# Patient Record
Sex: Male | Born: 1995 | Race: Black or African American | Hispanic: No | Marital: Single | Smoking: Never smoker
Health system: Southern US, Community
[De-identification: ages and names within clinical notes are randomized; demographics above are authoritative.]

## PROBLEM LIST (undated history)

## (undated) DIAGNOSIS — F329 Major depressive disorder, single episode, unspecified: Secondary | ICD-10-CM

## (undated) DIAGNOSIS — S82899A Other fracture of unspecified lower leg, initial encounter for closed fracture: Secondary | ICD-10-CM

## (undated) DIAGNOSIS — F32A Depression, unspecified: Secondary | ICD-10-CM

---
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
## 2014-01-05 ENCOUNTER — Encounter: Payer: Self-pay | Admitting: Family Medicine

## 2014-01-05 ENCOUNTER — Ambulatory Visit (INDEPENDENT_AMBULATORY_CARE_PROVIDER_SITE_OTHER): Payer: Medicaid Other | Admitting: Family Medicine

## 2014-01-05 VITALS — BP 124/74 | HR 56 | Temp 98.1°F | Resp 18 | Wt 167.0 lb

## 2014-01-05 DIAGNOSIS — H60399 Other infective otitis externa, unspecified ear: Secondary | ICD-10-CM

## 2014-01-05 DIAGNOSIS — F900 Attention-deficit hyperactivity disorder, predominantly inattentive type: Secondary | ICD-10-CM

## 2014-01-05 DIAGNOSIS — F988 Other specified behavioral and emotional disorders with onset usually occurring in childhood and adolescence: Secondary | ICD-10-CM

## 2014-01-05 DIAGNOSIS — F341 Dysthymic disorder: Secondary | ICD-10-CM

## 2014-01-05 DIAGNOSIS — F418 Other specified anxiety disorders: Secondary | ICD-10-CM

## 2014-01-05 NOTE — Assessment & Plan Note (Signed)
Doing well on Adderrall 5mg  . Continue  No further palpitations and school performance improving

## 2014-01-05 NOTE — Assessment & Plan Note (Signed)
No furhter issues.  Feel the majority of this was from family stress and illegal drug use w/ possible laced drugs

## 2014-01-05 NOTE — Progress Notes (Signed)
Bonnielee HaffMiles Bouknight is a 18 y.o. male who [facility]  ADHD: decreased adderall to 5mg  and feels less like a zombie. Better focus in school. No further palpitations. Mood around friends is nml per pt.   Depression: no further depression. No SI/HI.   Ear pain: comes and goes. Auralgan w/ relief. No using H202 bilat.  Eye Redness: occurred once in class. Unsure of allergies. Resolved w/o intervention. Sits in back of class.  The following portions of the patient's history were reviewed and updated as appropriate: allergies, current medications, past medical history, family and social history, and problem list.  Patient is a nonsmoker.  Past Medical History  Diagnosis Date  . Broken ankle     bilateral  . Depression     ROS as above otherwise neg.    Medications reviewed. Current Outpatient Prescriptions  Medication Sig Dispense Refill  . amphetamine-dextroamphetamine (ADDERALL XR) 10 MG 24 hr capsule Take 1 capsule (10 mg total) by mouth daily.  30 capsule  0  . amphetamine-dextroamphetamine (ADDERALL XR) 5 MG 24 hr capsule Take 1 capsule (5 mg total) by mouth daily.  30 capsule  0  . antipyrine-benzocaine (AURALGAN) otic solution Place 3-4 drops into the left ear every 2 (two) hours as needed for ear pain.  10 mL  0   No current facility-administered medications for this visit.    Exam:  BP 124/74  Pulse 56  Temp(Src) 98.1 F (36.7 C) (Oral)  Resp 18  Wt 167 lb (75.751 kg)  SpO2 98% Gen: Well NAD HEENT: EOMI,  MMM   No results found for this or any previous visit (from the past 72 hour(s)).  A/P (as seen in Problem list)  No problem-specific assessment & plan notes found for this encounter.

## 2014-01-05 NOTE — Assessment & Plan Note (Signed)
Continue auralgan and start H2O2 bilat

## 2014-01-05 NOTE — Patient Instructions (Signed)
Continue the Adderrall 5mg   Continue the auralgan Please use the hydrogen peroxide water mix in both ears every day for a week or two Please keep track of all of your musculoskeletal pain symptoms If your eye redness comes back then let me know and I can presceibe a drop to help Please come back in 4 weeks

## 2014-11-01 IMAGING — CR DG CHEST 2V
2 series · 2 of 2 positions shown · non-contrast
Comparison: None.

CLINICAL DATA: Shortness of breath.

EXAM:
CHEST  2 VIEW

[w chest pa]
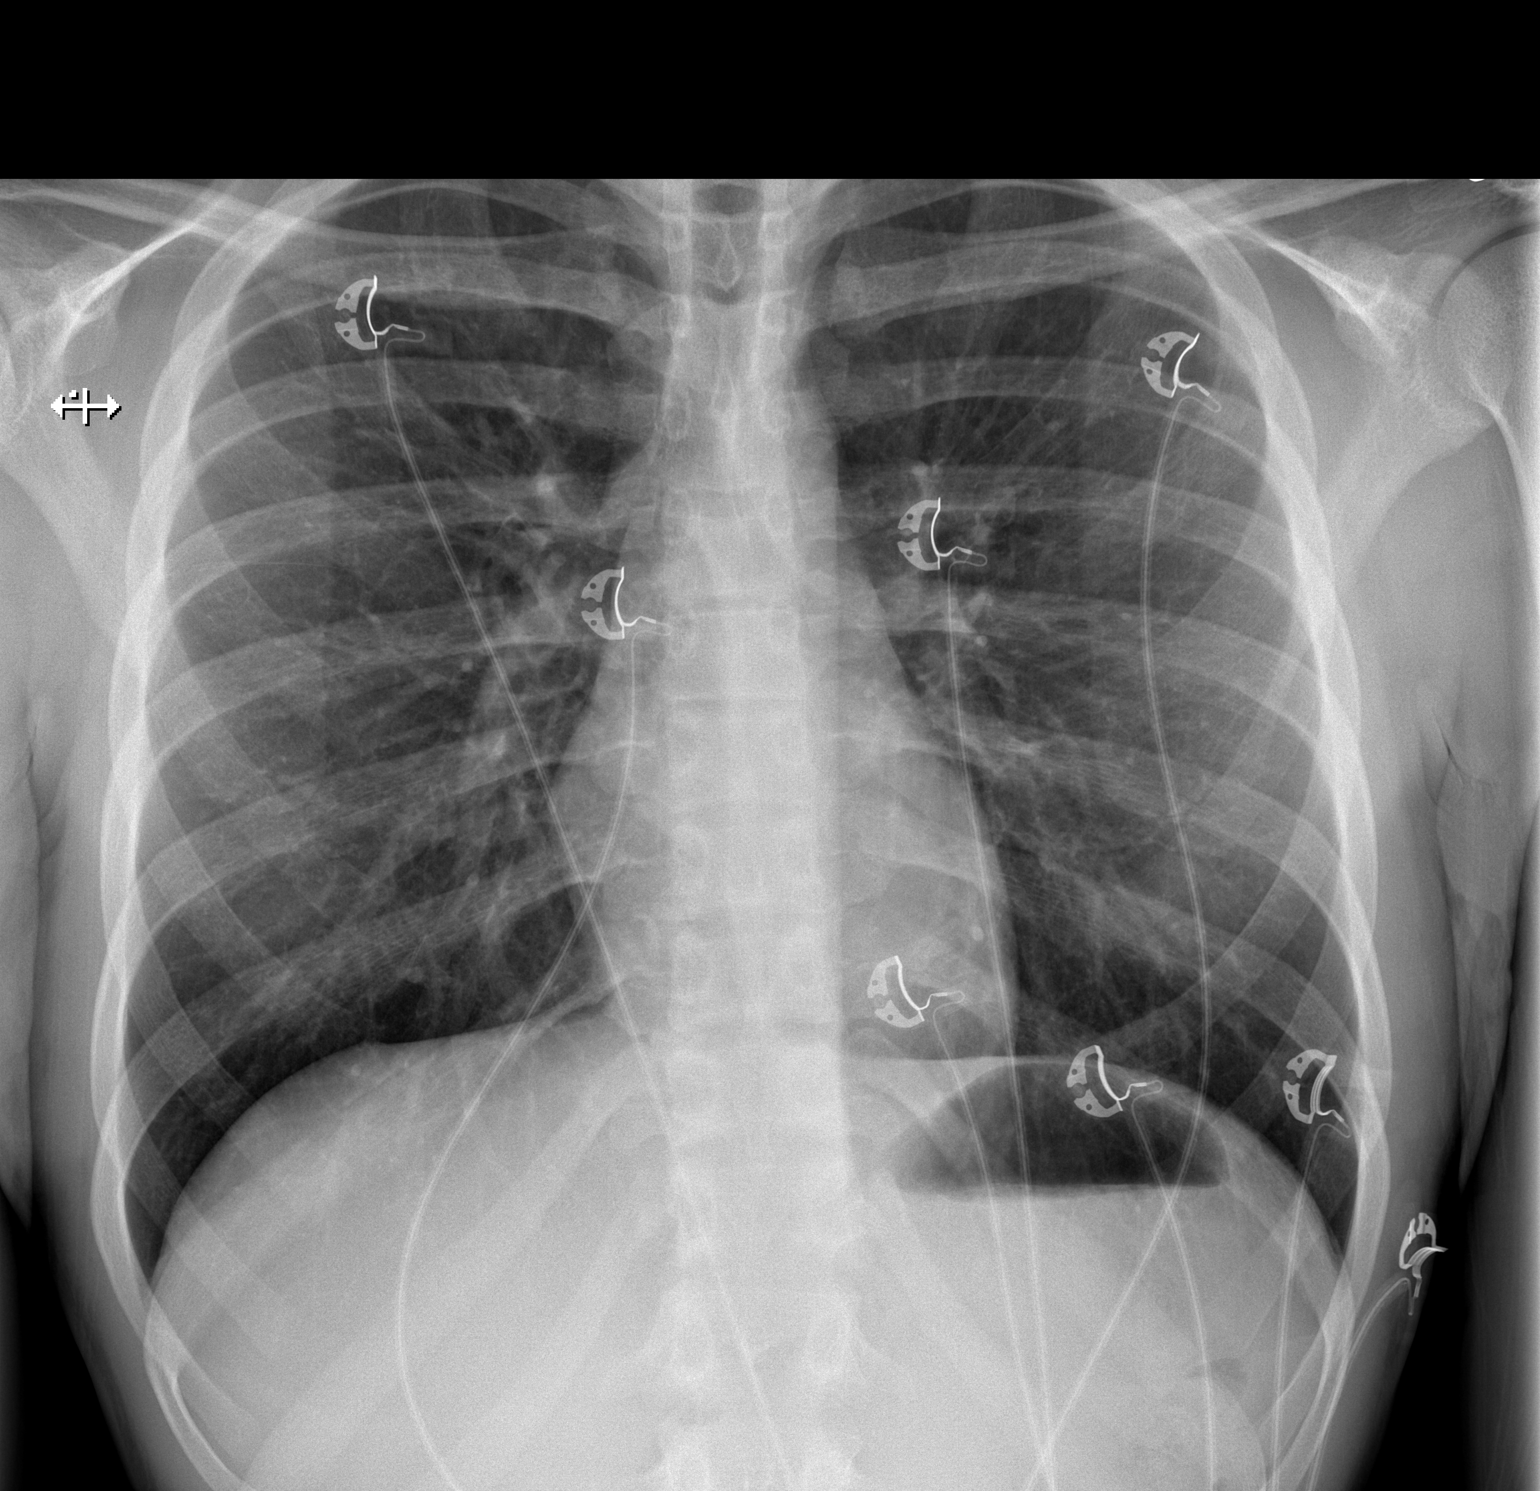

[w chest lat]
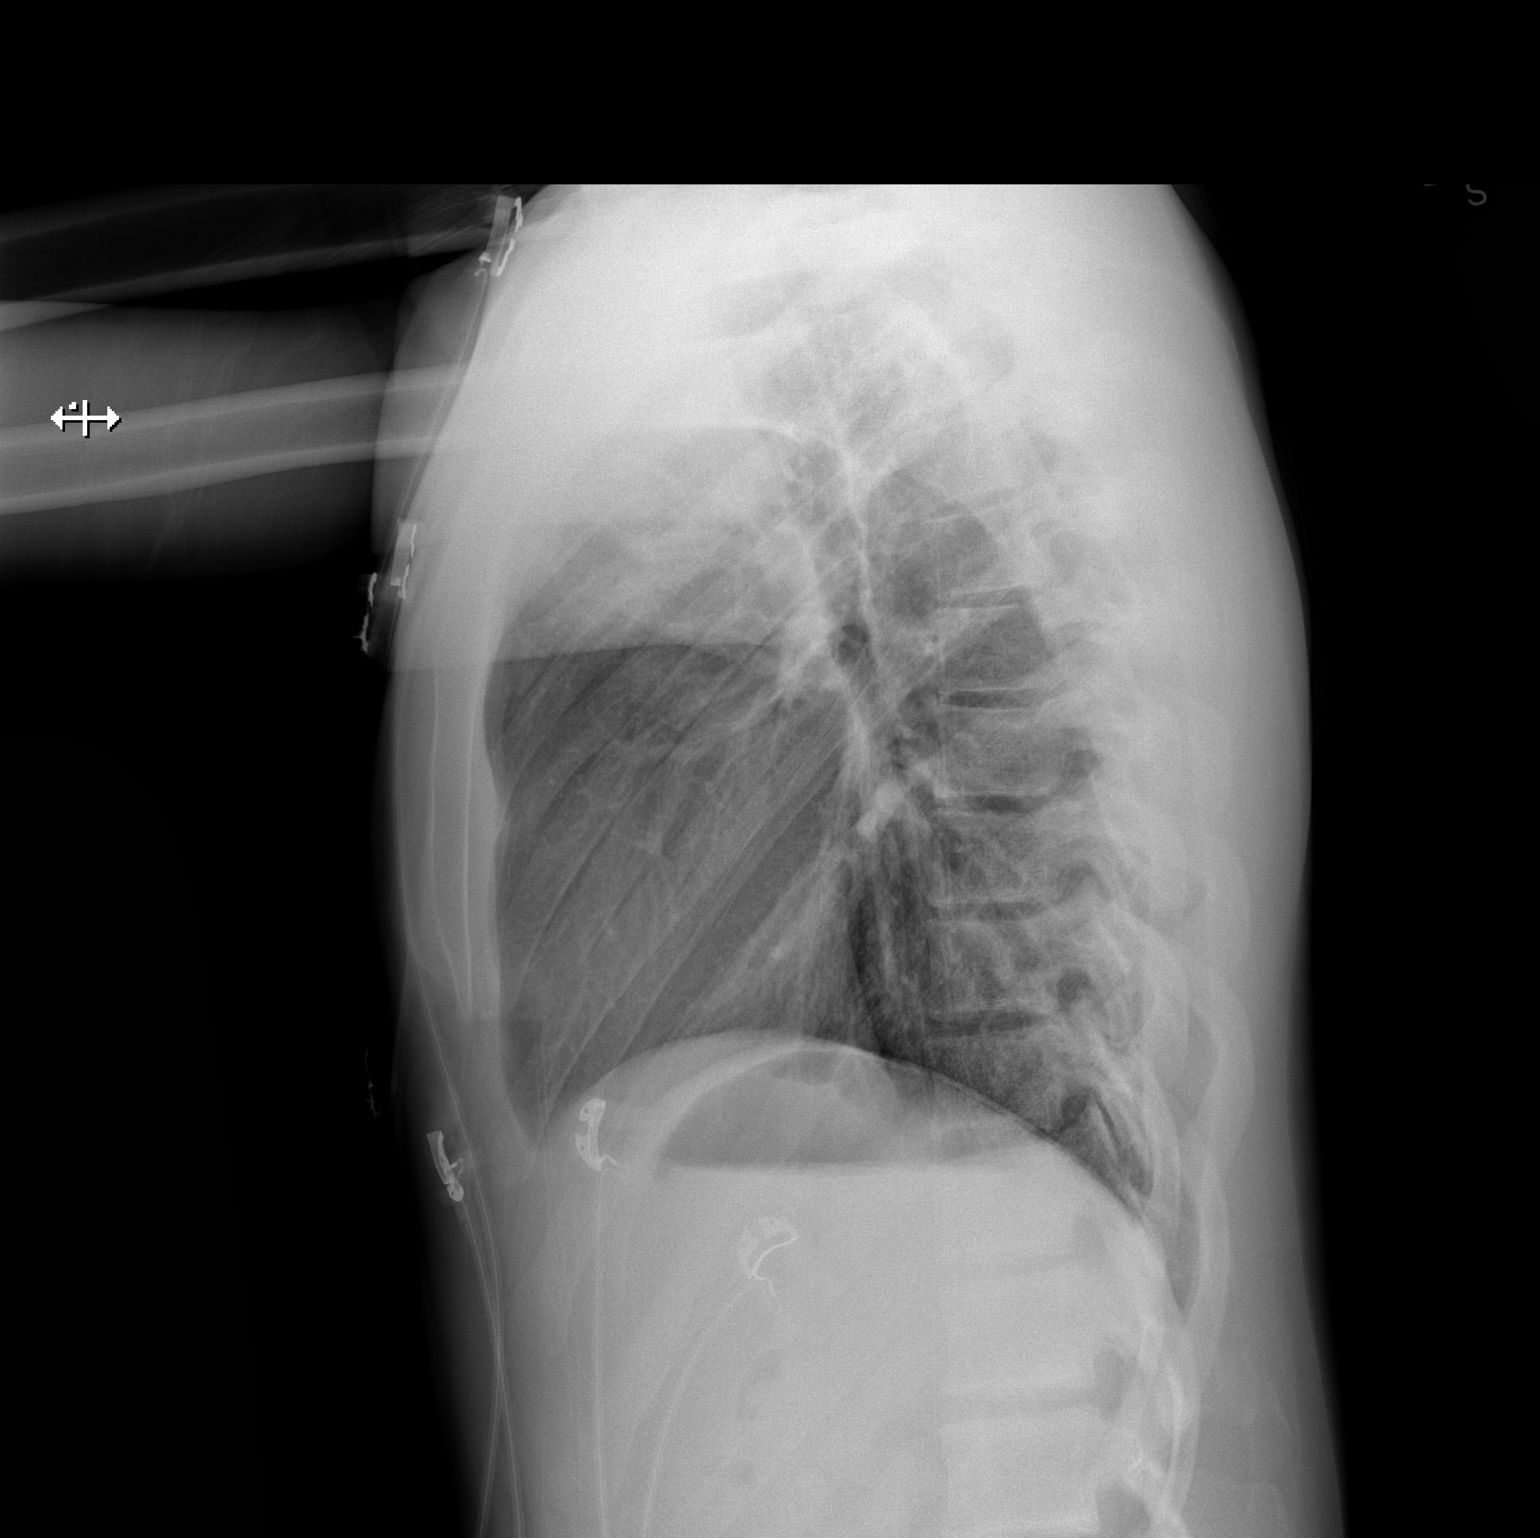

[2 of 2 positions shown; findings below may reference images not displayed]

FINDINGS: The heart size and mediastinal contours are within normal limits.
Both lungs are clear. The visualized skeletal structures are
unremarkable.
IMPRESSION: No active cardiopulmonary disease.

## 2015-02-15 ENCOUNTER — Emergency Department (HOSPITAL_COMMUNITY)
Admission: EM | Admit: 2015-02-15 | Discharge: 2015-02-15 | Disposition: A | Discharge status: Home / Self Care | Payer: Medicaid Other | Attending: Emergency Medicine | Admitting: Emergency Medicine

## 2015-02-15 ENCOUNTER — Encounter (HOSPITAL_COMMUNITY): Payer: Self-pay | Admitting: Emergency Medicine

## 2015-02-15 DIAGNOSIS — J029 Acute pharyngitis, unspecified: Secondary | ICD-10-CM | POA: Insufficient documentation

## 2015-02-15 DIAGNOSIS — Z8659 Personal history of other mental and behavioral disorders: Secondary | ICD-10-CM | POA: Insufficient documentation

## 2015-02-15 DIAGNOSIS — Z8781 Personal history of (healed) traumatic fracture: Secondary | ICD-10-CM | POA: Insufficient documentation

## 2015-02-15 NOTE — ED Notes (Addendum)
Patient comes in complaining of a sore throat and pain when swallowing his saliva.  He states that he "swallowed an apple seed about a week and half ago".  Complains that it "feels stuck in his voice box".  Patient denies SOB or fevers.  States he has difficulty swallowing foods and liquids now.  Explains that it has a "strange feeling" but denies scratchiness or pain.  Patient in no apparent distress, speaking clearly and full sentences, and ambulated in with steady gait.  No stridor or wheezing present and no excessive salivation.

## 2015-02-15 NOTE — ED Provider Notes (Signed)
CSN: [id number]     Arrival date & time 02/15/15  1613 History  This chart was scribed for [employer], PA-C, working with Eber Hong, MD by Chestine Spore, ED Scribe. The patient was seen in room WTR8/WTR8 at 5:42 PM.    Chief Complaint  Patient presents with  . Sore Throat    swallowed apple seed about a week and half ago     Patient is a 19 y.o. male presenting with pharyngitis. The history is provided by the patient. No language interpreter was used.  Sore Throat This is a new problem. The current episode started more than 1 week ago. The problem occurs constantly. The problem has not changed since onset.Pertinent negatives include no chest pain, no abdominal pain and no shortness of breath. The symptoms are aggravated by swallowing, eating and drinking. Nothing relieves the symptoms. He has tried water (and bread) for the symptoms. The treatment provided no relief.    HPI Comments: Ian Rosales is a 19 y.o. male who presents to the Emergency Department complaining of sore throat onset 1.5 weeks ago. Pt swallowed an apple seed and he thinks that the seed is still present in his "voice box". Pt rates the pain as 2/10 and states that it is constant. Pt describes the pain as sharp when he tries to swallow water or food, but states he's able to swallow liquids and solids without regurgitation or difficulty (other than pain).  He states that he has not tried any medications for the relief of his symptoms. The pt did try eating bread for the relief of his symptoms, which did not help. He denies cough, wheezing, fever, chills, rhinorrhea, ear pain, ear drainage, trismus, drooling, stridor, CP, SOB, n/v, abdominal pain, or any other symptoms. Denies sick contacts.   Past Medical History  Diagnosis Date  . Broken ankle     bilateral  . Depression    History reviewed. No pertinent past surgical history. Family History  Problem Relation Age of Onset  . Depression Mother    History   Substance Use Topics  . Smoking status: Never Smoker   . Smokeless tobacco: Not on file  . Alcohol Use: No    Review of Systems  Constitutional: Negative for fever and chills.  HENT: Positive for sore throat and trouble swallowing (painful, but able to complete without regurgitation). Negative for drooling, ear discharge, ear pain and rhinorrhea.   Respiratory: Negative for cough, shortness of breath, wheezing and stridor.   Cardiovascular: Negative for chest pain.  Gastrointestinal: Negative for nausea, vomiting and abdominal pain.  Musculoskeletal: Negative for neck pain.  Allergic/Immunologic: Negative for immunocompromised state.    A complete 10 system review of systems was obtained and all systems are negative except as noted in the HPI and PMH.    Allergies  Review of patient's allergies indicates no known allergies.  Home Medications   Prior to Admission medications   Medication Sig Start Date End Date Taking? Authorizing Provider  amphetamine-dextroamphetamine (ADDERALL XR) 10 MG 24 hr capsule Take 1 capsule (10 mg total) by mouth daily. Patient not taking: Reported on 02/15/2015 12/14/13   Benjaman Pott, MD  amphetamine-dextroamphetamine (ADDERALL XR) 5 MG 24 hr capsule Take 1 capsule (5 mg total) by mouth daily. Patient not taking: Reported on 02/15/2015 12/25/13   Ozella Rocks, MD  antipyrine-benzocaine Lyla Son) otic solution Place 3-4 drops into the left ear every 2 (two) hours as needed for ear pain. Patient not taking: Reported on 02/15/2015  12/18/13   Ozella Rocksavid J Merrell, MD   BP 125/67 mmHg  Pulse 85  Temp(Src) 98 F (36.7 C) (Oral)  SpO2 96%   Physical Exam  Constitutional: He is oriented to person, place, and time. Vital signs are normal. He appears well-developed and well-nourished.  Non-toxic appearance. No distress.  Afebrile, nontoxic, NAD  HENT:  Head: Normocephalic and atraumatic.  Mouth/Throat: Uvula is midline, oropharynx is clear and moist and  mucous membranes are normal. No trismus in the jaw. No uvula swelling.  Oropharynx clear and moist without erythema or tonsillar exudate, patent airway without obvious abrasions in the oropharynx. No drooling, trismus, or stridor.   Eyes: Conjunctivae and EOM are normal. Right eye exhibits no discharge. Left eye exhibits no discharge.  Neck: Normal range of motion. Neck supple.  Cardiovascular: Normal rate.   Pulmonary/Chest: Effort normal. No respiratory distress.  Abdominal: Normal appearance. He exhibits no distension.  Musculoskeletal: Normal range of motion.  Neurological: He is alert and oriented to person, place, and time. He has normal strength. No sensory deficit.  Skin: Skin is warm, dry and intact. No rash noted.  Psychiatric: He has a normal mood and affect.  Nursing note and vitals reviewed.   ED Course  Procedures (including critical care time) DIAGNOSTIC STUDIES: Oxygen Saturation is 96% on RA, normal by my interpretation.    COORDINATION OF CARE: 5:47 PM-Discussed treatment plan which includes f/u with GI specialist, keep drinking fluids, f/u if the symptoms worsen with pt at bedside and pt agreed to plan.   Labs Review Labs Reviewed - No data to display  Imaging Review No results found.   EKG Interpretation None      MDM   Final diagnoses:  Sore throat    19 y.o. male here with sensation of having an apple seed "stuck" in his "voice box". Patent airway without drooling or stridor, no visualized abrasions or wounds to posterior oropharynx, no FB noted on exam. Discussed that pt likely had small abrasion to esophagus which is causing globus sensation, but that without direct visualization of piriform recess/vocal folds, I cannot completely r/o retained FB, although I think it's unlikely. Discussed f/up with GI for possible endoscopy/direct visualization of this area in order to definitively determine if it's present or not. Discussed use of chloraseptic spray and  copious amounts of water to help flush any possible FBs out. I explained the diagnosis and have given explicit precautions to return to the ER including for any other new or worsening symptoms. The patient understands and accepts the medical plan as it's been dictated and I have answered their questions. Discharge instructions concerning home care and prescriptions have been given. The patient is STABLE and is discharged to home in good condition.   I personally performed the services described in this documentation, which was scribed in my presence. The recorded information has been reviewed and is accurate.  BP 127/70 mmHg  Pulse 58  Temp(Src) 98 F (36.7 C) (Oral)  Resp 18  SpO2 99%    Chelesea Weiand Camprubi-Soms, PA-C 02/15/15 2105  Eber HongBrian Miller, MD 02/17/15 [phone]

## 2015-02-15 NOTE — ED Notes (Signed)
Pt not drooling, no stridor noted. Able to speak full/clear sentences.

## 2015-02-15 NOTE — Discharge Instructions (Signed)
Call the gastroenterologist on Monday to make an appointment for further evaluation for the sensation of an apple seed stuck in your throat. Stay well hydrated and drink a lot of water to try to flush it out. Use chloraseptic spray as needed for pain. Return to the ER for changes or worsening symptoms.   Sore Throat A sore throat is pain, burning, irritation, or scratchiness of the throat. There is often pain or tenderness when swallowing or talking. A sore throat may be accompanied by other symptoms, such as coughing, sneezing, fever, and swollen neck glands. A sore throat is often the first sign of another sickness, such as a cold, flu, strep throat, or mononucleosis (commonly known as mono). Most sore throats go away without medical treatment. CAUSES  The most common causes of a sore throat include:  A viral infection, such as a cold, flu, or mono.  A bacterial infection, such as strep throat, tonsillitis, or whooping cough.  Seasonal allergies.  Dryness in the air.  Irritants, such as smoke or pollution.  Gastroesophageal reflux disease (GERD). HOME CARE INSTRUCTIONS   Only take over-the-counter medicines as directed by your caregiver.  Drink enough fluids to keep your urine clear or pale yellow.  Rest as needed.  Try using throat sprays, lozenges, or sucking on hard candy to ease any pain (if older than 4 years or as directed).  Sip warm liquids, such as broth, herbal tea, or warm water with honey to relieve pain temporarily. You may also eat or drink cold or frozen liquids such as frozen ice pops.  Gargle with salt water (mix 1 tsp salt with 8 oz of water).  Do not smoke and avoid secondhand smoke.  Put a cool-mist humidifier in your bedroom at night to moisten the air. You can also turn on a hot shower and sit in the bathroom with the door closed for 5-10 minutes. SEEK IMMEDIATE MEDICAL CARE IF:  You have difficulty breathing.  You are unable to swallow fluids, soft  foods, or your saliva.  You have increased swelling in the throat.  Your sore throat does not get better in 7 days.  You have nausea and vomiting.  You have a fever or persistent symptoms for more than 2-3 days.  You have a fever and your symptoms suddenly get worse. MAKE SURE YOU:   Understand these instructions.  Will watch your condition.  Will get help right away if you are not doing well or get worse. Document Released: 12/24/2004 Document Revised: 11/02/2012 Document Reviewed: 07/24/2012 [facility] Patient Information 2015 [employer]. This information is not intended to replace advice given to you by your health care provider. Make sure you discuss any questions you have with your health care provider.

## 2021-10-11 ENCOUNTER — Emergency Department (HOSPITAL_COMMUNITY)
Admission: EM | Admit: 2021-10-11 | Discharge: 2021-10-12 | Disposition: A | Discharge status: Other Acute Inpatient Hospital | Payer: No Typology Code available for payment source | Attending: Emergency Medicine | Admitting: Emergency Medicine

## 2021-10-11 ENCOUNTER — Encounter (HOSPITAL_COMMUNITY): Payer: Self-pay

## 2021-10-11 DIAGNOSIS — Z20822 Contact with and (suspected) exposure to covid-19: Secondary | ICD-10-CM | POA: Diagnosis not present

## 2021-10-11 DIAGNOSIS — F411 Generalized anxiety disorder: Secondary | ICD-10-CM | POA: Insufficient documentation

## 2021-10-11 DIAGNOSIS — R443 Hallucinations, unspecified: Secondary | ICD-10-CM | POA: Insufficient documentation

## 2021-10-11 DIAGNOSIS — R45851 Suicidal ideations: Secondary | ICD-10-CM | POA: Diagnosis not present

## 2021-10-11 DIAGNOSIS — F332 Major depressive disorder, recurrent severe without psychotic features: Secondary | ICD-10-CM | POA: Insufficient documentation

## 2021-10-11 LAB — COMPREHENSIVE METABOLIC PANEL
ALT: 13 U/L (ref 0–44)
AST: 20 U/L (ref 15–41)
Albumin: 4.4 g/dL (ref 3.5–5.0)
Alkaline Phosphatase: 52 U/L (ref 38–126)
Anion gap: 6 (ref 5–15)
BUN: 11 mg/dL (ref 6–20)
CO2: 28 mmol/L (ref 22–32)
Calcium: 9.2 mg/dL (ref 8.9–10.3)
Chloride: 105 mmol/L (ref 98–111)
Creatinine, Ser: 1.01 mg/dL (ref 0.61–1.24)
GFR, Estimated: >60 mL/min (ref >60)
Glucose, Bld: 90 mg/dL (ref 70–99)
Potassium: 3.8 mmol/L (ref 3.5–5.1)
Sodium: 139 mmol/L (ref 135–145)
Total Bilirubin: 1.7 mg/dL — ABNORMAL HIGH (ref 0.3–1.2)
Total Protein: 7.3 g/dL (ref 6.5–8.1)

## 2021-10-11 LAB — CBC WITH DIFFERENTIAL/PLATELET
Abs Immature Granulocytes: 0.01 10*3/uL (ref 0.00–0.07)
Basophils Absolute: 0 10*3/uL (ref 0.0–0.1)
Basophils Relative: 1 %
Eosinophils Absolute: 0 10*3/uL (ref 0.0–0.5)
Eosinophils Relative: 1 %
HCT: 43.7 % (ref 39.0–52.0)
Hemoglobin: 15.4 g/dL (ref 13.0–17.0)
Immature Granulocytes: 0 %
Lymphocytes Relative: 31 %
Lymphs Abs: 1.3 10*3/uL (ref 0.7–4.0)
MCH: 30.8 pg (ref 26.0–34.0)
MCHC: 35.2 g/dL (ref 30.0–36.0)
MCV: 87.4 fL (ref 80.0–100.0)
Monocytes Absolute: 0.5 10*3/uL (ref 0.1–1.0)
Monocytes Relative: 12 %
Neutro Abs: 2.3 10*3/uL (ref 1.7–7.7)
Neutrophils Relative %: 55 %
Platelets: 234 10*3/uL (ref 150–400)
RBC: 5 MIL/uL (ref 4.22–5.81)
RDW: 12.4 % (ref 11.5–15.5)
WBC: 4.1 10*3/uL (ref 4.0–10.5)
nRBC: 0 % (ref 0.0–0.2)

## 2021-10-11 LAB — RAPID URINE DRUG SCREEN, HOSP PERFORMED
Amphetamines: NOT DETECTED
Barbiturates: NOT DETECTED
Benzodiazepines: NOT DETECTED
Cocaine: NOT DETECTED
Opiates: NOT DETECTED
Tetrahydrocannabinol: NOT DETECTED

## 2021-10-11 LAB — ETHANOL: Alcohol, Ethyl (B): <10 mg/dL (ref <10)

## 2021-10-11 LAB — RESP PANEL BY RT-PCR (FLU A&B, COVID) ARPGX2
Influenza A by PCR: NEGATIVE
Influenza B by PCR: NEGATIVE
SARS Coronavirus 2 by RT PCR: NEGATIVE

## 2021-10-11 MED ORDER — IBUPROFEN 200 MG PO TABS
400.0000 mg | ORAL_TABLET | Freq: Four times a day (QID) | ORAL | Status: DC | PRN
  Administered 2021-10-11: 400 mg via ORAL
  Filled 2021-10-11: qty 2

## 2021-10-11 MED ORDER — MELATONIN 3 MG PO TABS
3.0000 mg | ORAL_TABLET | Freq: Every day | ORAL | Status: DC
  Administered 2021-10-11: 3 mg via ORAL
  Filled 2021-10-11: qty 1

## 2021-10-11 NOTE — ED Notes (Addendum)
Pt brought with him wallet, Phone, Hoodie, pants and a pair of shoes. Items have been placed behind triage area. Pt has been wanded down by security

## 2021-10-11 NOTE — ED Provider Notes (Signed)
Emergency Medicine Provider Triage Evaluation Note  Ian Rosales , a 25 y.o. male  was evaluated in triage.  Pt complains of suicidal ideation.  Patient states he has a history of anxiety which has been worsening over the past few months to the point where he has now having thoughts of ending his life.  He denies HI, visual or auditory hallucinations.  Review of Systems  Positive: SI Negative: HI, hallucinations  Physical Exam  BP (!) 151/87 (BP Location: Right Arm)   Pulse 84   Temp 98.8 F (37.1 C) (Oral)   Resp 18   SpO2 97%  Gen:   Awake, no distress   Resp:  Normal effort MSK:   Moves extremities without difficulty  Other:    Medical Decision Making  Medically screening exam initiated at 12:34 PM.  Appropriate orders placed.  Drue Harr was informed that the remainder of the evaluation will be completed by another provider, this initial triage assessment does not replace that evaluation, and the importance of remaining in the ED until their evaluation is complete.     Vear Clock 10/11/21 1236    Tegeler, Canary Brim, MD 10/11/21 1505

## 2021-10-11 NOTE — ED Triage Notes (Signed)
Pt reports increased anxiety over the past couple of months. The Anxiety has worsened to where he has been experiencing thoughts of suicide. Denies any plan. No HI.

## 2021-10-11 NOTE — ED Notes (Signed)
Pt NAD in bed, a/ox4. Slightly fast speech. Pt states he had been travelling and staying in [place] where he developed a sudden onset of anxiety followed later by suicidal thoughts. Pt states he has had anxiety and depression as a teen and then it went away while in the [employer]. Pt denies specific plan for suicide, but states "I am just too comfortable with those thoughts". Pt denies auditory or visual hallucinations. Denies drug/ETOH use.

## 2021-10-11 NOTE — ED Provider Notes (Signed)
[facility]-EMERGENCY DEPT Provider Note   CSN: [id number] Arrival date & time: 10/11/21  1134     History Chief Complaint  Patient presents with   Suicidal    Ian Rosales is a 25 y.o. male.  HPI Patient presents complaining of anxiety, ongoing for several weeks, with intermittent thoughts of suicide, without plan.  He is also occasionally having troubling thoughts.  He described a vision of a small child, an attractive girl, and this image bothered him.  He does not have any command hallucinations.  He states he had been visiting [place], several weeks ago and was doing a lot of drugs while there.  His anxiety started during that period of time, as well as a thoughts of suicide.  He currently lives with family members were supportive.  He states that his father brought him here.  He denies recent illnesses including fever, vomiting, cough or chest pain.  There are no other known active modifying factors.    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 No pertinent surgical history.     Family History  Problem Relation Age of Onset   Depression Mother     Social History   Tobacco Use   Smoking status: Never   Smokeless tobacco: Never  Substance Use Topics   Alcohol use: No   Drug use: No    Types: Marijuana    Home Medications Prior to Admission medications   Medication Sig Start Date End Date Taking? Authorizing Provider  amphetamine-dextroamphetamine (ADDERALL XR) 10 MG 24 hr capsule Take 1 capsule (10 mg total) by mouth daily. Patient not taking: No sig reported 12/14/13   Benjaman Pott, MD  amphetamine-dextroamphetamine (ADDERALL XR) 5 MG 24 hr capsule  Take 1 capsule (5 mg total) by mouth daily. Patient not taking: No sig reported 12/25/13   Ozella Rocks, MD  antipyrine-benzocaine Lyla Son) otic solution Place 3-4 drops into the left ear every 2 (two) hours as needed for ear pain. Patient not taking: No sig reported 12/18/13   Ozella Rocks, MD    Allergies    Patient has no known allergies.  Review of Systems   Review of Systems  All other systems reviewed and are negative.  Physical Exam Updated Vital Signs BP (!) 148/89 (BP Location: Right Arm)   Pulse 69   Temp 98.8 F (37.1 C) (Oral)   Resp 16   SpO2 98%   Physical Exam Vitals and nursing note reviewed.  Constitutional:      General: He is not in acute distress.    Appearance: He is well-developed. He is not ill-appearing, toxic-appearing or diaphoretic.  HENT:     Head: Normocephalic and atraumatic.     Right Ear: External ear normal.     Left Ear: External ear normal.  Eyes:     Conjunctiva/sclera: Conjunctivae normal.     Pupils: Pupils are equal, round, and reactive to light.  Neck:     Trachea: Phonation normal.  Cardiovascular:     Rate and Rhythm: Normal rate.  Pulmonary:     Effort: Pulmonary effort is normal.  Abdominal:     General: There is no distension.  Palpations: Abdomen is soft.  Musculoskeletal:        General: Normal range of motion.     Cervical back: Normal range of motion and neck supple.  Skin:    General: Skin is warm and dry.     Coloration: Skin is not jaundiced or pale.  Neurological:     Mental Status: He is alert and oriented to person, place, and time.     Cranial Nerves: No cranial nerve deficit.     Sensory: No sensory deficit.     Motor: No abnormal muscle tone.     Coordination: Coordination normal.  Psychiatric:        Mood and Affect: Mood normal.        Behavior: Behavior normal.        Thought Content: Thought content normal.        Judgment: Judgment normal.    ED Results / Procedures / Treatments    Labs (all labs ordered are listed, but only abnormal results are displayed) Labs Reviewed  COMPREHENSIVE METABOLIC PANEL - Abnormal; Notable for the following components:      Result Value   Total Bilirubin 1.7 (*)    All other components within normal limits  RESP PANEL BY RT-PCR (FLU A&B, COVID) ARPGX2  ETHANOL  RAPID URINE DRUG SCREEN, HOSP PERFORMED  CBC WITH DIFFERENTIAL/PLATELET    EKG None  Radiology No results found.  Procedures Procedures   Medications Ordered in ED Medications - No data to display  ED Course  I have reviewed the triage vital signs and the nursing notes.  Pertinent labs & imaging results that were available during my care of the patient were reviewed by me and considered in my medical decision making (see chart for details).    MDM Rules/Calculators/A&P                            Patient Vitals for the past 24 hrs:  BP Temp Temp src Pulse Resp SpO2  10/11/21 1857 (!) 148/89 -- -- 69 16 98 %  10/11/21 1659 (!) 141/82 -- -- 80 16 99 %  10/11/21 1139 (!) 151/87 98.8 F (37.1 C) Oral 84 18 97 %    7:42 PM Reevaluation with update and discussion. After initial assessment and treatment, an updated evaluation reveals resting comfortably in bed. [name]   Medical Decision Making:  This patient is presenting for evaluation of suicidal ideation and anxiety, which does require a range of treatment options, and is a complaint that involves a moderate risk of morbidity and mortality. The differential diagnoses include depression, anxiety, suicidal ideation. I decided to review old records, and in summary patient with polysubstance abuse, presenting with suicidality and troubling visions.  I did not require additional historical information from anyone.  Clinical Laboratory Tests Ordered, included CBC, Metabolic panel, and alcohol level, UDS , viral panel. Review indicates normal.   Critical Interventions-clinical evaluation, laboratory  testing, TTS consultation  After These Interventions, the Patient was reevaluated and was found medically cleared for treatment by psychiatry.  CRITICAL CARE-no Performed by: [name]  Nursing Notes Reviewed/ Care Coordinated Applicable Imaging Reviewed Interpretation of Laboratory Data incorporated into ED treatment  Plan disposition by TTS in conjunction with oncoming provider team    Final Clinical Impression(s) / ED Diagnoses Final diagnoses:  Suicidal ideation    Rx / DC Orders ED Discharge Orders     None  [name], MD 10/11/21 1944
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
## 2021-10-11 NOTE — ED Notes (Signed)
Pt care taken, father is with patient.

## 2021-10-12 ENCOUNTER — Ambulatory Visit (HOSPITAL_COMMUNITY)
Admission: EM | Admit: 2021-10-12 | Discharge: 2021-10-12 | Disposition: A | Discharge status: Home / Self Care | Payer: No Typology Code available for payment source | Attending: Nurse Practitioner | Admitting: Nurse Practitioner

## 2021-10-12 DIAGNOSIS — F410 Panic disorder [episodic paroxysmal anxiety] without agoraphobia: Secondary | ICD-10-CM | POA: Diagnosis not present

## 2021-10-12 DIAGNOSIS — F411 Generalized anxiety disorder: Secondary | ICD-10-CM | POA: Diagnosis not present

## 2021-10-12 DIAGNOSIS — F332 Major depressive disorder, recurrent severe without psychotic features: Secondary | ICD-10-CM

## 2021-10-12 LAB — LIPID PANEL
Cholesterol: 189 mg/dL (ref 0–200)
HDL: 39 mg/dL — ABNORMAL LOW (ref >40)
LDL Cholesterol: 140 mg/dL — ABNORMAL HIGH (ref 0–99)
Total CHOL/HDL Ratio: 4.8 RATIO
Triglycerides: 50 mg/dL (ref <150)
VLDL: 10 mg/dL (ref 0–40)

## 2021-10-12 LAB — HEMOGLOBIN A1C
Hgb A1c MFr Bld: 4.1 % — ABNORMAL LOW (ref 4.8–5.6)
Mean Plasma Glucose: 70.97 mg/dL

## 2021-10-12 LAB — TSH: TSH: 2.17 u[IU]/mL (ref 0.350–4.500)

## 2021-10-12 LAB — MAGNESIUM: Magnesium: 2 mg/dL (ref 1.7–2.4)

## 2021-10-12 MED ORDER — ACETAMINOPHEN 325 MG PO TABS: 650.0000 mg | ORAL_TABLET | Freq: Four times a day (QID) | ORAL | Status: DC | PRN

## 2021-10-12 MED ORDER — MAGNESIUM HYDROXIDE 400 MG/5ML PO SUSP: 30.0000 mL | Freq: Every day | ORAL | Status: DC | PRN

## 2021-10-12 MED ORDER — MIRTAZAPINE 7.5 MG PO TABS: 7.5000 mg | ORAL_TABLET | Freq: Every day | ORAL | 0 refills | Status: AC

## 2021-10-12 MED ORDER — GABAPENTIN 100 MG PO CAPS: 100.0000 mg | ORAL_CAPSULE | Freq: Two times a day (BID) | ORAL | Status: DC

## 2021-10-12 MED ORDER — MIRTAZAPINE 7.5 MG PO TABS
7.5000 mg | ORAL_TABLET | Freq: Once | ORAL | Status: AC
  Administered 2021-10-12: 7.5 mg via ORAL
  Filled 2021-10-12: qty 1

## 2021-10-12 MED ORDER — GABAPENTIN 100 MG PO CAPS
ORAL_CAPSULE | ORAL | Status: AC
  Filled 2021-10-12: qty 1

## 2021-10-12 MED ORDER — IBUPROFEN 600 MG PO TABS: 600.0000 mg | ORAL_TABLET | Freq: Three times a day (TID) | ORAL | Status: DC | PRN

## 2021-10-12 MED ORDER — LORAZEPAM 1 MG PO TABS
1.0000 mg | ORAL_TABLET | Freq: Once | ORAL | Status: AC
  Administered 2021-10-12: 1 mg via ORAL
  Filled 2021-10-12: qty 1

## 2021-10-12 MED ORDER — GABAPENTIN 100 MG PO CAPS
100.0000 mg | ORAL_CAPSULE | Freq: Two times a day (BID) | ORAL | Status: DC
  Administered 2021-10-12: 100 mg via ORAL

## 2021-10-12 MED ORDER — GABAPENTIN 100 MG PO CAPS: 100.0000 mg | ORAL_CAPSULE | Freq: Two times a day (BID) | ORAL | 0 refills | Status: AC

## 2021-10-12 MED ORDER — ALUM & MAG HYDROXIDE-SIMETH 200-200-20 MG/5ML PO SUSP: 30.0000 mL | ORAL | Status: DC | PRN
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
## 2021-10-12 NOTE — ED Notes (Signed)
Left voicemail on Dad's phone to come pick up Pt. [phone]
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
## 2021-10-12 NOTE — Discharge Instructions (Addendum)
Take all medications as prescribed. Keep all follow-up appointments as scheduled.  Do not consume alcohol or use illegal drugs while on prescription medications. Report any adverse effects from your medications to your primary care provider promptly.  In the event of recurrent symptoms or worsening symptoms, call 911, a crisis hotline, or go to the nearest emergency department for evaluation.    VA Services & Counselor Services:  [facility] Address: [address], [place], [place] [address] Phone: [phone]  We offer confidential help for [facility] and service members, and their families at no cost in a non-medical setting. Our services include counseling for needs such as depression, post-traumatic stress disorder (PTSD), and the psychological effects of military sexual trauma (MST). We can also connect you with more support in [place] and your community.  [facility] Address: [address] [place], [place] [address] Phone: [phone]  [facility]-will provide timely access to mental health services for children and adolescents (4-17) and adults presenting in a mental health crisis. The program is designed for those who need urgent Behavioral Health or Substance Use treatment and are not experiencing a medical crisis that would typically require an emergency room visit.    [address] [place], [place] [address] Phone: [phone] [email or web address]   [facility] will also offer the following outpatient services: (Monday through Friday 8am-5pm)   Partial Hospitalization Program (PHP) Substance Abuse Intensive Outpatient Program (SA-IOP) Group Therapy Medication Management Peer Living Room   We also provide (24/7):    Assessments: Our mental health clinician and providers will conduct a focused mental health evaluation, assessing for immediate safety concerns and further mental health needs.    Referral: Our team will provide resources and help connect to community based mental health treatment, when indicated, including psychotherapy, psychiatry, and other specialized behavioral health or substance use disorder services (for those not already in treatment).   Transitional Care: Our team providers in person bridging and/or telphonic follow-up during the patient's transition to outpatient services.

## 2021-10-12 NOTE — ED Notes (Signed)
Safe transport called to transport pt to [facility]

## 2021-10-12 NOTE — ED Notes (Signed)
[facility] called to collect STAT labs and to deliver to [facility].

## 2021-10-12 NOTE — ED Notes (Signed)
Mother called. She would like an update after he speaks with Psych

## 2021-10-12 NOTE — ED Provider Notes (Signed)
FBC/OBS ASAP Discharge Summary  Date and Time: 10/12/2021 4:12 PM  Name: Ian Rosales  MRN:  [id number]   Discharge Diagnoses:  Final diagnoses:  Severe episode of recurrent major depressive disorder, without psychotic features (HCC)  GAD (generalized anxiety disorder)  Panic attacks    Subjective: Ian Rosales is a 25 year old male who reports worsening anxiety and panic attacks.  States ongoing ruminations with racing thoughts and inability to focus.  Initially asked to be restarted on Adderall.  States he is followed by the [place] where he is prescribed hydroxyzine and trazodone.  He reports taking trazodone with worsening panic/anxiety attacks.  Denied illicit drug use or substance abuse history.  Discussed initiating gabapentin 100 mg p.o. twice daily and Remeron 7.5 mg p.o. nightly.  Patient was receptive to plan.  Patient to follow-up with intensive outpatient programming/partial hospitalization programming.  CSW to provide additional outpatient resources for [facility] in [place].  Patient to discharge patient was receptive to plan.  NP spoke to patient's sister Ian Rosales for additional collateral.  She denied any safety concerns with patient returning home.  States he resides with her father.  Denies guns or weapons are in the home.  Denied previous suicide attempts.  Stay Summary:  Per admission assessment note: Ian Rosales is a 25 y/o male with a history of MDD, GAD and SI. Patient presented to [facility] ED voluntarily with his father for worsening anxiety symptoms and suicidal ideations. Patient reports that he had anxiety symptoms since he was in high school. Patient reports that he used to smoke marijuana in high school and he noticed that he would have anxiety after smoking. Patient reports having a difficult childhood and that his mother was addict to drugs and that his parents would argue and fight a lot. Patient reports that his mother has been diagnosed with Bipolar  Disorder and his brother has been diagnosed with anxiety disorder.   Total Time spent with patient: 15 minutes  Past Psychiatric History:  Past Medical History:  Past Medical History:  Diagnosis Date   Broken ankle    bilateral   Depression    No past surgical history on file. Family History:  Family History  Problem Relation Age of Onset   Depression Mother    Family Psychiatric History:  Social History:  Social History   Substance and Sexual Activity  Alcohol Use No     Social History   Substance and Sexual Activity  Drug Use No   Types: Marijuana    Social History   Socioeconomic History   Marital status: Single    Spouse name: Not on file   Number of children: Not on file   Years of education: Not on file   Highest education level: Not on file  Occupational History   Not on file  Tobacco Use   Smoking status: Never   Smokeless tobacco: Never  Substance and Sexual Activity   Alcohol use: No   Drug use: No    Types: Marijuana   Sexual activity: Not on file  Other Topics Concern   Not on file  Social History Narrative   Not on file   Social Determinants of Health   Financial Resource Strain: Not on file  Food Insecurity: Not on file  Transportation Needs: Not on file  Physical Activity: Not on file  Stress: Not on file  Social Connections: Not on file   SDOH:  SDOH Screenings   Alcohol Screen: Not on file  Depression (  PHQ2-9): Not on file  Financial Resource Strain: Not on file  Food Insecurity: Not on file  Housing: Not on file  Physical Activity: Not on file  Social Connections: Not on file  Stress: Not on file  Tobacco Use: Low Risk    Smoking Tobacco Use: Never   Smokeless Tobacco Use: Never   Passive Exposure: Not on file  Transportation Needs: Not on file    Tobacco Cessation:  N/A, patient does not currently use tobacco products  Current Medications:  Current Facility-Administered Medications  Medication Dose Route Frequency  Provider Last Rate Last Admin   acetaminophen (TYLENOL) tablet 650 mg  650 mg Oral Q6H PRN Bobbitt, Shalon E, NP       alum & mag hydroxide-simeth (MAALOX/MYLANTA) 200-200-20 MG/5ML suspension 30 mL  30 mL Oral Q4H PRN Bobbitt, Shalon E, NP       gabapentin (NEURONTIN) capsule 100 mg  100 mg Oral BID Oneta Rack, NP       ibuprofen (ADVIL) tablet 600 mg  600 mg Oral Q8H PRN Bobbitt, Shalon E, NP       magnesium hydroxide (MILK OF MAGNESIA) suspension 30 mL  30 mL Oral Daily PRN Bobbitt, Shalon E, NP       mirtazapine (REMERON) tablet 7.5 mg  7.5 mg Oral Once Oneta Rack, NP       Current Outpatient Medications  Medication Sig Dispense Refill   amphetamine-dextroamphetamine (ADDERALL XR) 10 MG 24 hr capsule Take 1 capsule (10 mg total) by mouth daily. (Patient not taking: No sig reported) 30 capsule 0   amphetamine-dextroamphetamine (ADDERALL XR) 5 MG 24 hr capsule Take 1 capsule (5 mg total) by mouth daily. (Patient not taking: No sig reported) 30 capsule 0   antipyrine-benzocaine (AURALGAN) otic solution Place 3-4 drops into the left ear every 2 (two) hours as needed for ear pain. (Patient not taking: No sig reported) 10 mL 0    PTA Medications: (Not in a hospital admission)   Musculoskeletal  Strength & Muscle Tone: within normal limits Gait & Station: normal Patient leans: N/A  Psychiatric Specialty Exam  Presentation  General Appearance: Appropriate for Environment; Casual  Eye Contact:Fair  Speech:Clear and Coherent  Speech Volume:Normal  Handedness:Right   Mood and Affect  Mood:Anxious; Depressed  Affect:Appropriate   Thought Process  Thought Processes:Coherent; Goal Directed  Descriptions of Associations:Intact  Orientation:Full (Time, Place and Person)  Thought Content:Logical  Diagnosis of Schizophrenia or Schizoaffective disorder in past: No    Hallucinations:Hallucinations: None  Ideas of Reference:None  Suicidal Thoughts:Suicidal Thoughts:  Yes, Passive SI Passive Intent and/or Plan: Without Intent; Without Plan  Homicidal Thoughts:Homicidal Thoughts: No   Sensorium  Memory:Immediate Good; Recent Good; Remote Good  Judgment:Good  Insight:Fair   Executive Functions  Concentration:Fair  Attention Span:Fair  Recall:Good  Fund of Knowledge:Good  Language:Good   Psychomotor Activity  Psychomotor Activity:Psychomotor Activity: Normal   Assets  Assets:Communication Skills; Desire for Improvement; Financial Resources/Insurance; Housing; Scientist, research (life sciences); Transportation; Resilience; Social Support   Sleep  Sleep:Sleep: Poor Number of Hours of Sleep: -1   Nutritional Assessment (For OBS and FBC admissions only) Has the patient had a weight loss or gain of 10 pounds or more in the last 3 months?: No Has the patient had a decrease in food intake/or appetite?: No Does the patient have dental problems?: No Does the patient have eating habits or behaviors that may be indicators of an eating disorder including binging or inducing vomiting?: No Has the patient recently  lost weight without trying?: 0 Has the patient been eating poorly because of a decreased appetite?: 0 Malnutrition Screening Tool Score: 0    Physical Exam  Physical Exam Vitals and nursing note reviewed.  Eyes:     Pupils: Pupils are equal, round, and reactive to light.  Cardiovascular:     Rate and Rhythm: Normal rate and regular rhythm.  Pulmonary:     Effort: Pulmonary effort is normal.  Neurological:     General: No focal deficit present.     Mental Status: He is alert and oriented to person, place, and time.  Psychiatric:        Attention and Perception: Attention and perception normal.        Mood and Affect: Mood is anxious.        Speech: Speech normal.        Behavior: Behavior normal.        Thought Content: Thought content normal. Thought content does not include suicidal ideation.        Cognition and Memory: Cognition  normal.        Judgment: Judgment normal.   Review of Systems  HENT: Negative.    Eyes: Negative.   Cardiovascular: Negative.   Musculoskeletal: Negative.   Endo/Heme/Allergies: Negative.   Psychiatric/Behavioral:  Positive for depression. Negative for hallucinations and suicidal ideas. The patient is nervous/anxious.   All other systems reviewed and are negative. Blood pressure (!) 122/96, pulse 100, temperature 98.3 F (36.8 C), temperature source Oral, resp. rate 18, SpO2 96 %. There is no height or weight on file to calculate BMI.  Demographic Factors:  Male  Loss Factors: NA  Historical Factors: NA  Risk Reduction Factors:   Living with another person, especially a relative, Positive social support, and Positive therapeutic relationship  Continued Clinical Symptoms:  Severe Anxiety and/or Agitation  Cognitive Features That Contribute To Risk:  Closed-mindedness    Suicide Risk:  Minimal: No identifiable suicidal ideation.  Patients presenting with no risk factors but with morbid ruminations; may be classified as minimal risk based on the severity of the depressive symptoms  Plan Of Care/Follow-up recommendations:  Activity:  as tolerated Diet:  heart healthy  -Patient to follow-up with partial hospitalization program --Initiated gabapentin 100 mg p.o. twice daily and Remeron 7.5 mg p.o. nightly  Disposition: Take all medications as prescribed. Keep all follow-up appointments as scheduled.  Do not consume alcohol or use illegal drugs while on prescription medications. Report any adverse effects from your medications to your primary care provider promptly.  In the event of recurrent symptoms or worsening symptoms, call 911, a crisis hotline, or go to the nearest emergency department for evaluation.    Oneta Rack, NP 10/12/2021, 4:12 PM

## 2021-10-12 NOTE — ED Notes (Signed)
Fresh set of vital signs including temperature is requested before sending pt to [facility]

## 2021-10-12 NOTE — ED Notes (Signed)
Discharge instructions provided and Pt stated understanding. Pt alert, orient and ambulatory prior to d/c from facility. Personal belongings returned from locker. Pt escorted to the front lobby to meet his dad to go home. Safety maintained.

## 2021-10-16 ENCOUNTER — Telehealth (HOSPITAL_COMMUNITY): Payer: Self-pay

## 2021-10-16 NOTE — BH Assessment (Signed)
Care Management - Follow Up [facility] Discharges   Writer attempted to make contact with patient today and was unsuccessful.  No voicemail was set up.  Per chart review, pt was provided [employer] .
